# Patient Record
Sex: Male | Born: 1995 | Race: Black or African American | Hispanic: No | Marital: Single | Smoking: Never smoker
Health system: Southern US, Community
[De-identification: ages and names within clinical notes are randomized; demographics above are authoritative.]

---
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
## 2015-07-13 ENCOUNTER — Emergency Department (HOSPITAL_BASED_OUTPATIENT_CLINIC_OR_DEPARTMENT_OTHER)
Admission: EM | Admit: 2015-07-13 | Discharge: 2015-07-13 | Disposition: A | Discharge status: Home / Self Care | Payer: Self-pay | Attending: Emergency Medicine | Admitting: Emergency Medicine

## 2015-07-13 ENCOUNTER — Emergency Department (HOSPITAL_BASED_OUTPATIENT_CLINIC_OR_DEPARTMENT_OTHER): Payer: Self-pay

## 2015-07-13 ENCOUNTER — Encounter (HOSPITAL_BASED_OUTPATIENT_CLINIC_OR_DEPARTMENT_OTHER): Payer: Self-pay | Admitting: Emergency Medicine

## 2015-07-13 DIAGNOSIS — J45909 Unspecified asthma, uncomplicated: Secondary | ICD-10-CM | POA: Insufficient documentation

## 2015-07-13 DIAGNOSIS — H539 Unspecified visual disturbance: Secondary | ICD-10-CM | POA: Insufficient documentation

## 2015-07-13 DIAGNOSIS — R51 Headache: Secondary | ICD-10-CM | POA: Insufficient documentation

## 2015-07-13 DIAGNOSIS — R519 Headache, unspecified: Secondary | ICD-10-CM

## 2015-07-13 MED ORDER — KETOROLAC TROMETHAMINE 30 MG/ML IJ SOLN
30.0000 mg | Freq: Once | INTRAMUSCULAR | Status: AC
  Administered 2015-07-13: 30 mg via INTRAVENOUS
  Filled 2015-07-13: qty 1

## 2015-07-13 MED ORDER — PROMETHAZINE HCL 25 MG/ML IJ SOLN
25.0000 mg | Freq: Once | INTRAMUSCULAR | Status: AC
  Administered 2015-07-13: 25 mg via INTRAVENOUS
  Filled 2015-07-13: qty 1

## 2015-07-13 NOTE — ED Provider Notes (Signed)
CSN: [id number]     Arrival date & time 07/13/15  4098 History   None    Chief Complaint  Patient presents with  . Headache    HPI    20 year old male presenting for headache started yesterday, 3/5.  Patient was at friend's house playing videogames, and on the way home developed frontal headache, pounding in nature such that he had to pull over. He went home and had took a motrin but his headache persisted. Was able to fall asleep eventually but upon awakening this AM, he still had pounding headache. He did not take medicine this AM, but presented to the ED.  Significantly he has a history of headaches typically treated with Motrin but this headache is worse in any previous headaches he ever had. He denies nausea, vomiting associated. However, bright lights and loud sounds exacerbate his headache. He denies fevers, however he recently had a cold that improved a few days before this headache started. He denies associated weakness, changes in vision, numbness tingling, shortness of breath, chest pain,  Past Medical History  Diagnosis Date  . Asthma    No past surgical history on file. No family history on file. Social History  Substance Use Topics  . Smoking status: Never Smoker   . Smokeless tobacco: None  . Alcohol Use: No    Review of Systems  Constitutional: Negative.   HENT: Negative.   Respiratory: Negative.   Cardiovascular: Negative.   Genitourinary: Negative.   Musculoskeletal: Negative.   Skin: Negative.   + headache, + photo phobia, + phonophobia  Allergies  Review of patient's allergies indicates no known allergies.  Home Medications   Prior to Admission medications   Not on File   BP 116/66 mmHg  Pulse 68  Temp(Src) 98.6 F (37 C) (Oral)  Resp 16  Ht  (1.753 m)  Wt 70.308 kg  BMI 22.88 kg/m2  SpO2 100% Physical Exam  Constitutional: He is oriented to person, place, and time. He appears well-developed and well-nourished.  HENT:  Head:  Normocephalic and atraumatic.  Eyes: Conjunctivae and EOM are normal. Pupils are equal, round, and reactive to light.  Neck: Normal range of motion. Neck supple.  Cardiovascular: Normal rate, regular rhythm and normal heart sounds.   Pulmonary/Chest: Effort normal and breath sounds normal. No respiratory distress.  Abdominal: Soft. Bowel sounds are normal. He exhibits no distension. There is no tenderness.  Neurological: He is alert and oriented to person, place, and time. No cranial nerve deficit.  Skin: Skin is warm and dry.  Psychiatric: He has a normal mood and affect.  MSK: Full neck ROM, negative kernig's and Brudzinki's sign Cranial Nerves II - XII - II - Visual field intact OU. III, IV, VI - Extraocular movements intact. V - Facial sensation intact bilaterally. VII - Facial movement intact bilaterally. VIII - Hearing & vestibular intact bilaterally. X - Palate elevates symmetrically, no dysarthria. XI - Chin turning & shoulder shrug intact bilaterally. XII - Tongue protrusion intact.  Motor Strength - The patient's strength was 5/5 in all extremities. Bulk was normal and fasciculations were absent.  Motor Tone - Muscle tone was assessed at the neck and appendages and was normal.   Gait and Station - normal   ED Course  Procedures (including critical care time) Labs Review Labs Reviewed - No data to display  Imaging Review Ct Head Wo Contrast  07/13/2015  CLINICAL DATA:  Headache about both eyes for 3 days. Initial encounter. No known  injury. EXAM: CT HEAD WITHOUT CONTRAST TECHNIQUE: Contiguous axial images were obtained from the base of the skull through the vertex without intravenous contrast. COMPARISON:  None. FINDINGS: The brain appears normal without hemorrhage, infarct, and mass lesion, mass effect, midline shift or abnormal extra-axial fluid collection. No hydrocephalus or pneumocephalus. Small mucous retention cysts or polyps are seen in the sphenoid sinuses. The  calvarium is intact. IMPRESSION: Negative head CT. Electronically Signed   By: Drusilla Kannerhomas  Dalessio M.D.   On: 07/13/2015 12:31   I have personally reviewed and evaluated these images and lab results as part of my medical decision-making.    MDM   Final diagnoses:  Acute nonintractable headache, unspecified headache type    20 year old male presenting for headache. History and physical exam consistent with headache of infectious etiology, negative since he is in Kernig sign, afebrile. Negative history of trauma makes subarachnoid hemorrhage unlikely. Patient given Phenergan and Toradol  in the ED with improvement in headache. CT head negative for pathology. When assessing him he stated that his headache had improved and was texting on his phone.  After demonstrating he was able to tolerate PO and ambulate without difficulty he was discharged to home with return precautions   Pritesh Sobecki A. Kennon RoundsHaney MD, MS Family Medicine Resident PGY-2 Pager [phone]     Bonney AidAlyssa A Annakate Soulier, MD 07/13/15 1245  Bonney AidAlyssa A Tannen Vandezande, MD 07/13/15 1311  Glynn OctaveStephen Rancour, MD 07/13/15 [phone]

## 2015-07-13 NOTE — ED Provider Notes (Signed)
By signing my name below, I, Sonum Patel, attest that this documentation has been prepared under the direction and in the presence of Glynn OctaveStephen Octivia Canion, MD. Electronically Signed: Sonum Patel, Neurosurgeoncribe. 07/13/2015. 11:07 AM.   I saw and evaluated the patient, reviewed the resident's note and I agree with the findings and plan. If applicable, I agree with the resident's interpretation of the EKG.  If applicable, I was present for critical portions of any procedures performed.  HPI Comments: Michael Savage is a 20 y.o. male who presents to the Emergency Department complaining of a gradual onset, constant, gradually worsened frontal HA that began last night while playing video games. He reports associated photophobia and phonophobia. He reports a family history of migraines but states he has not been diagnosed with this. He reports similar HA's last year but states this is the worst case. He denies fever, vomiting, blurry or double vision.   Photophobic. Frontal sinus tenderness. No meningismus. Cranial nerves 2-12 intact. 5/5 strength throughout, no ataxia on finger to nose. Romberg neg. Normal gait. No nystagmus.    Gradual onset headache progressively worse since last night with photophobia and phonophobia and nausea. Nonfocal neurological exam. Low suspicion for subarachnoid hemorrhage, meningitis or temporal arteritis.  I personally performed the services described in this documentation, which was scribed in my presence. The recorded information has been reviewed and is accurate.   Glynn OctaveStephen Luwanda Starr, MD 07/13/15 [phone]

## 2015-07-13 NOTE — ED Notes (Signed)
Headache over both eyes, no fever since Friday.  Pt relates pain is worsening.  No runny nose or cough.  Light and sound sensitive.

## 2015-07-13 NOTE — Discharge Instructions (Signed)
Follow up with PCP for headache If you have worsening headache, weakness, changes in vision, return to the ED for evaluation

## 2015-10-24 ENCOUNTER — Encounter (HOSPITAL_COMMUNITY): Payer: Self-pay | Admitting: Family Medicine

## 2015-10-24 ENCOUNTER — Other Ambulatory Visit: Payer: Self-pay

## 2015-10-24 ENCOUNTER — Emergency Department (HOSPITAL_COMMUNITY)
Admission: EM | Admit: 2015-10-24 | Discharge: 2015-10-24 | Disposition: A | Discharge status: Home / Self Care | Payer: Self-pay | Attending: Emergency Medicine | Admitting: Emergency Medicine

## 2015-10-24 ENCOUNTER — Emergency Department (HOSPITAL_COMMUNITY): Payer: Self-pay

## 2015-10-24 DIAGNOSIS — R079 Chest pain, unspecified: Secondary | ICD-10-CM

## 2015-10-24 DIAGNOSIS — R0602 Shortness of breath: Secondary | ICD-10-CM

## 2015-10-24 DIAGNOSIS — Z791 Long term (current) use of non-steroidal anti-inflammatories (NSAID): Secondary | ICD-10-CM | POA: Insufficient documentation

## 2015-10-24 DIAGNOSIS — J45909 Unspecified asthma, uncomplicated: Secondary | ICD-10-CM | POA: Insufficient documentation

## 2015-10-24 DIAGNOSIS — I319 Disease of pericardium, unspecified: Secondary | ICD-10-CM | POA: Insufficient documentation

## 2015-10-24 LAB — I-STAT TROPONIN, ED
TROPONIN I, POC: 0 ng/mL (ref 0.00–0.08)
Troponin i, poc: 0 ng/mL (ref 0.00–0.08)

## 2015-10-24 LAB — CBC WITH DIFFERENTIAL/PLATELET
BASOS ABS: 0 10*3/uL (ref 0.0–0.1)
Basophils Relative: 0 %
EOS ABS: 0 10*3/uL (ref 0.0–0.7)
Eosinophils Relative: 0 %
HEMATOCRIT: 43.6 % (ref 39.0–52.0)
Hemoglobin: 14.9 g/dL (ref 13.0–17.0)
LYMPHS ABS: 1.1 10*3/uL (ref 0.7–4.0)
Lymphocytes Relative: 23 %
MCH: 31.1 pg (ref 26.0–34.0)
MCHC: 34.2 g/dL (ref 30.0–36.0)
MCV: 91 fL (ref 78.0–100.0)
MONOS PCT: 8 %
Monocytes Absolute: 0.4 10*3/uL (ref 0.1–1.0)
Neutro Abs: 3.4 10*3/uL (ref 1.7–7.7)
Neutrophils Relative %: 69 %
Platelets: 215 10*3/uL (ref 150–400)
RBC: 4.79 MIL/uL (ref 4.22–5.81)
RDW: 13.7 % (ref 11.5–15.5)
WBC: 4.9 10*3/uL (ref 4.0–10.5)

## 2015-10-24 LAB — COMPREHENSIVE METABOLIC PANEL
ALBUMIN: 4.3 g/dL (ref 3.5–5.0)
ALT: 16 U/L — ABNORMAL LOW (ref 17–63)
ANION GAP: 11 (ref 5–15)
AST: 31 U/L (ref 15–41)
Alkaline Phosphatase: 70 U/L (ref 38–126)
BILIRUBIN TOTAL: 1.1 mg/dL (ref 0.3–1.2)
BUN: 14 mg/dL (ref 6–20)
CHLORIDE: 107 mmol/L (ref 101–111)
CO2: 20 mmol/L — AB (ref 22–32)
Calcium: 9.5 mg/dL (ref 8.9–10.3)
Creatinine, Ser: 0.97 mg/dL (ref 0.61–1.24)
GFR calc Af Amer: >60 mL/min (ref >60)
GFR calc non Af Amer: >60 mL/min (ref >60)
GLUCOSE: 102 mg/dL — AB (ref 65–99)
POTASSIUM: 3.8 mmol/L (ref 3.5–5.1)
SODIUM: 138 mmol/L (ref 135–145)
TOTAL PROTEIN: 7.3 g/dL (ref 6.5–8.1)

## 2015-10-24 LAB — D-DIMER, QUANTITATIVE: D-Dimer, Quant: 0.28 ug/mL-FEU (ref 0.00–0.50)

## 2015-10-24 MED ORDER — ALBUTEROL SULFATE (2.5 MG/3ML) 0.083% IN NEBU
5.0000 mg | INHALATION_SOLUTION | Freq: Once | RESPIRATORY_TRACT | Status: AC
  Administered 2015-10-24: 5 mg via RESPIRATORY_TRACT
  Filled 2015-10-24: qty 6

## 2015-10-24 MED ORDER — COLCHICINE 0.6 MG PO TABS: 0.6000 mg | ORAL_TABLET | Freq: Two times a day (BID) | ORAL | Status: AC

## 2015-10-24 MED ORDER — INDOMETHACIN 25 MG PO CAPS: 50.0000 mg | ORAL_CAPSULE | Freq: Three times a day (TID) | ORAL | Status: AC

## 2015-10-24 NOTE — ED Notes (Signed)
Pt here with SOB that started PTA/ sts he was siting when it started. Hx of asthma. Denies anxiety.

## 2015-10-24 NOTE — ED Notes (Signed)
Called lab to add on D dimer  

## 2015-10-24 NOTE — Discharge Instructions (Signed)
Follow-up with your primary care provider on Monday regarding your visit to the emergency department today. Also follow-up with cardiology next week to be seen next week. Take the medications as prescribed. Be sure to take the indomethacin with food.  Return to emergency department if you experience worsening chest pain, worsening shortness of breath, dizziness, sweating, nausea, vomiting, visual changes or you pass out.  Chest Wall Pain Chest wall pain is pain in or around the bones and muscles of your chest. Sometimes, an injury causes this pain. Sometimes, the cause may not be known. This pain may take several weeks or longer to get better. HOME CARE INSTRUCTIONS  Pay attention to any changes in your symptoms. Take these actions to help with your pain:   Rest as told by your health care provider.   Avoid activities that cause pain. These include any activities that use your chest muscles or your abdominal and side muscles to lift heavy items.   If directed, apply ice to the painful area:  Put ice in a plastic bag.  Place a towel between your skin and the bag.  Leave the ice on for 20 minutes, 2-3 times per day.  Take over-the-counter and prescription medicines only as told by your health care provider.  Do not use tobacco products, including cigarettes, chewing tobacco, and e-cigarettes. If you need help quitting, ask your health care provider.  Keep all follow-up visits as told by your health care provider. This is important. SEEK MEDICAL CARE IF:  You have a fever.  Your chest pain becomes worse.  You have new symptoms. SEEK IMMEDIATE MEDICAL CARE IF:  You have nausea or vomiting.  You feel sweaty or light-headed.  You have a cough with phlegm (sputum) or you cough up blood.  You develop shortness of breath.   This information is not intended to replace advice given to you by your health care provider. Make sure you discuss any questions you have with your health  care provider.   Document Released: 04/25/2005 Document Revised: 01/14/2015 Document Reviewed: 07/21/2014 Elsevier Interactive Patient Education 2016 [employer]  Pericarditis Pericarditis is swelling (inflammation) of the pericardium. The pericardium is a thin, double-layered, fluid-filled tissue sac that surrounds the heart. The purpose of the pericardium is to contain the heart in the chest cavity and keep the heart from overexpanding. Different types of pericarditis can occur, such as:  Acute pericarditis. Inflammation can develop suddenly in acute pericarditis.  Chronic pericarditis. Inflammation develops gradually and is long-lasting in chronic pericarditis.  Constrictive pericarditis. In this type of pericarditis, the layers of the pericardium stiffen and develop scar tissue. The scar tissue thickens and sticks together. This makes it difficult for the heart to pump and work as it normally does. CAUSES  Pericarditis can be caused from different conditions, such as:  A bacterial, fungal or viral infection.  After a heart attack (myocardial infarction).  After open-heart surgery (coronary bypass graft surgery).  Auto-immune conditions such as lupus, rheumatoid arthritis or scleroderma.  Kidney failure.  Low thyroid condition (hypothyroidism).  Cancer from another part of the body that has spread (metastasized) to the pericardium.  Chest injury or trauma.  After radiation treatment.  Certain medicines. SYMPTOMS  Symptoms of pericarditis can include:  Chest pain. Chest pain symptoms may increase when laying down and may be relieved when sitting up and leaning forward.  A chronic, dry cough.  Heart palpitations. These may feel like rapid, fluttering or pounding heart beats.  Chest pain may  be worse when swallowing.  Dizziness or fainting.  Tiredness, fatigue or lethargy.  Fever. DIAGNOSIS  Pericarditis is diagnosed by the following:  A physical exam. A heart  sound called a pericardial friction rub may be heard when your caregiver listens to your heart.  Blood work. Blood may be drawn to check for an infection and to look at your blood chemistry.  Electrocardiography. During electrocardiography your heart's electrical activity is monitored and recorded with a tracing on paper (electrocardiogram [ECG]).  Echocardiography.  Computed tomography (CT).  Magnetic resonance image (MRI). TREATMENT  To treat pericarditis, it is important to know the cause of it. The cause of pericarditis determines the treatment.   If the cause of pericarditis is due to an infection, treatment is based on the type of infection. If an infection is suspected in the pericardial fluid, a procedure called a pericardial fluid culture and biopsy may be done. This takes a sample of the pericardial fluid. The sample is sent to a lab which runs tests on the pericardial fluid to check for an infection.  If the autoimmune disease is the cause, treatment of the autoimmune condition will help improve the pericarditis.  If the cause of pericarditis is not known, anti-inflammatory medicines may be used to help decrease the inflammation.  Surgery may be needed. The following are types of surgeries or procedures that may be done to treat pericarditis:  Pericardial window. A pericardial window makes a cut (incision) into the pericardial sac. This allows excess fluid in the pericardium to drain.  Pericardiocentesis. A pericardiocentesis is also known as a pericardial tap. This procedure uses a needle that is guided by X-ray to drain (aspirate) excess fluid from the pericardium.  Pericardiectomy. A pericardiectomy removes part or all of the pericardium. HOME CARE INSTRUCTIONS   Do not smoke. If you smoke, quit. Your caregiver can help you quit smoking.  Maintain a healthy weight.  Follow an exercise program as directed by your health care provider. You may need to limit your  exercising until your symptoms go away.  If you drink alcohol, do so in moderation.  Eat a heart healthy diet. A registered dietitian can help you learn about healthy food choices.  Keep a list of all your medicines with you at all times. Include the name, dose, how often it is taken and how it is taken. SEEK IMMEDIATE MEDICAL CARE IF:   You have chest pain or feelings of chest pressure.  You have sweating (diaphoresis) when at rest.  You have irregular heartbeats (palpitations).  You have rapid, racing heart beats.  You have unexplained fainting episodes.  You feel sick to your stomach (nausea) or vomiting without cause.  You have unexplained weakness. If you develop any of the symptoms which originally made you seek care, call for local emergency medical help. Do not drive yourself to the hospital.   This information is not intended to replace advice given to you by your health care provider. Make sure you discuss any questions you have with your health care provider.   Document Released: 10/19/2000 Document Revised: 09/09/2014 Document Reviewed: 11/05/2014 Elsevier Interactive Patient Education [employer]2016 Elsevier Inc.

## 2015-10-24 NOTE — ED Notes (Signed)
Called lab to inquire about d dimer.

## 2015-10-24 NOTE — ED Notes (Signed)
Pt A&OX4, ambulatory at d/c with steady gait, NAD. Pt verbalized discharge instructions and follow up care and prescription information. Pt states he will follow up with cardiology and his PCP as soon as possible. Pt discharged with family and states he has all of his belongings with him at d/c.

## 2015-10-24 NOTE — ED Provider Notes (Signed)
CSN: [id number]     Arrival date & time 10/24/15  1107 History   First MD Initiated Contact with Patient 10/24/15 1132     Chief Complaint  Patient presents with  . Shortness of Breath     (Consider location/radiation/quality/duration/timing/severity/associated sxs/prior Treatment) HPI   Patient is a 20 year old male with a history of asthma who presents the ED with rapid onset chest tightness and shortness of breath since 1100am. He states this does not feel like other episodes of asthma. Associated dizziness. Chest tightness is constant, position does not make it better or worse, difficulty taking a deep breath. Patient states he was girlfriend got into an argument prior to the onset of symptoms. Patient denies IV drug use or cocaine use. Patient denies headache, blurred vision, nausea, vomiting, fever, abdominal pain, diaphoresis.  Past Medical History  Diagnosis Date  . Asthma    History reviewed. No pertinent past surgical history. History reviewed. No pertinent family history. Social History  Substance Use Topics  . Smoking status: Never Smoker   . Smokeless tobacco: None  . Alcohol Use: No    Review of Systems  Constitutional: Negative for fever and chills.  HENT: Negative for voice change.   Eyes: Negative for visual disturbance.  Respiratory: Positive for chest tightness and shortness of breath. Negative for cough and wheezing.   Cardiovascular: Negative for chest pain and leg swelling.  Gastrointestinal: Negative for nausea, vomiting, abdominal pain and diarrhea.  Musculoskeletal: Negative for back pain.  Skin: Negative for rash.  Allergic/Immunologic: Negative for immunocompromised state.  Neurological: Positive for dizziness. Negative for syncope, weakness, numbness and headaches.      Allergies  Review of patient's allergies indicates no known allergies.  Home Medications   Prior to Admission medications   Medication Sig Start Date End Date Taking?  Authorizing Provider  colchicine 0.6 MG tablet Take 1 tablet (0.6 mg total) by mouth 2 (two) times daily. 10/24/15   Jerre Simon, PA  indomethacin (INDOCIN) 25 MG capsule Take 2 capsules (50 mg total) by mouth 3 (three) times daily with meals. 10/24/15   Shaquel Josephson L Lezly Rumpf, PA   BP 105/67 mmHg  Pulse 122  Temp(Src) 97.8 F (36.6 C) (Oral)  Resp 20  SpO2 100% Physical Exam  Constitutional: He appears well-developed and well-nourished. No distress.  Pt appears anxious and is trembling   HENT:  Head: Normocephalic and atraumatic.  Eyes: Conjunctivae are normal.  Neck: Normal range of motion.  Cardiovascular: Regular rhythm and normal heart sounds.  Tachycardia present.  Exam reveals no gallop and no friction rub.   No murmur heard. Pulses:      Radial pulses are 2+ on the right side, and 2+ on the left side.       Dorsalis pedis pulses are 2+ on the right side, and 2+ on the left side.  Pulmonary/Chest: Effort normal and breath sounds normal. No respiratory distress. He has no wheezes. He has no rales.  Abdominal: Soft. There is no tenderness.  Musculoskeletal: Normal range of motion. He exhibits no edema.  Neurological: He is alert. Coordination normal.  Skin: Skin is warm and dry. No rash noted. He is not diaphoretic.  Psychiatric: His speech is normal and behavior is normal. His mood appears anxious.  Nursing note and vitals reviewed.   ED Course  Procedures (including critical care time) Labs Review Labs Reviewed  COMPREHENSIVE METABOLIC PANEL - Abnormal; Notable for the following:    CO2 20 (*)    Glucose,  Bld 102 (*)    ALT 16 (*)    All other components within normal limits  CBC WITH DIFFERENTIAL/PLATELET  D-DIMER, QUANTITATIVE (NOT AT [facility])  Rosezena SensorI-STAT TROPOININ, ED  Rosezena SensorI-STAT TROPOININ, ED    Imaging Review Dg Chest 2 View  10/24/2015  CLINICAL DATA:  Shortness of breath. EXAM: CHEST  2 VIEW COMPARISON:  Chest CT 09/23/2013. FINDINGS: The heart size and mediastinal  contours are within normal limits. Both lungs are clear. The visualized skeletal structures are unremarkable. IMPRESSION: No active cardiopulmonary disease. Electronically Signed   By: Annia Beltrew  Davis M.D.   On: 10/24/2015 12:36   I have personally reviewed and evaluated these images and lab results as part of my medical decision-making.   EKG Interpretation   Date/Time:  Saturday October 24 2015 11:15:16 EDT Ventricular Rate:  120 PR Interval:  154 QRS Duration: 94 QT Interval:  320 QTC Calculation: 452 R Axis:   79 Text Interpretation:  Sinus tachycardia ST elevations primarily with  appearance of benign early repolarization, howeverconcern for more diffuse  PR depressions and ST  elevations for possible pericarditis No previous  ECGs available Confirmed by [facility] MD, ERIN ([id number]) on 10/24/2015  5:52:46 PM      MDM   Final diagnoses:  Chest pain, unspecified chest pain type  Pericarditis  Shortness of breath   Patient with chest pain and shortness of breath. EKG concerning for pericarditis. Chest x-ray revealed no acute cardiopulmonary disease. D-dimer negative, troponins x2 negative, other labs unremarkable, O2 sats 99-100%, VSS except for mild intermittent tachycardia. Patient states he was girlfriend had an argument before the onset of symptoms.  Will treat patient for pericarditis with indomethacin and colchicine. Discussed strict follow-up with cardiology within 2 days. Patient was stable at time of discharge, he states his breathing and chest pain had improved. Instructed patient to follow-up with the CP and cardiology and discussed strict return precautions to include but not limited to: chest pain becomes exertional with associated shortness of breath and diaphoresis or pain radiating to left jaw and left arm.  Patient expressed understanding to the discharge instructions.  Jerre SimonJessica L Mckade Gurka, PA 10/25/15 1615  Alvira MondayErin Schlossman, MD 10/27/15 [phone]

## 2017-03-18 IMAGING — CR DG CHEST 2V
2 series · 2 of 2 positions shown · non-contrast
Comparison: Chest CT 09/23/2013.

CLINICAL DATA: Shortness of breath.

EXAM:
CHEST  2 VIEW

[chest pa]
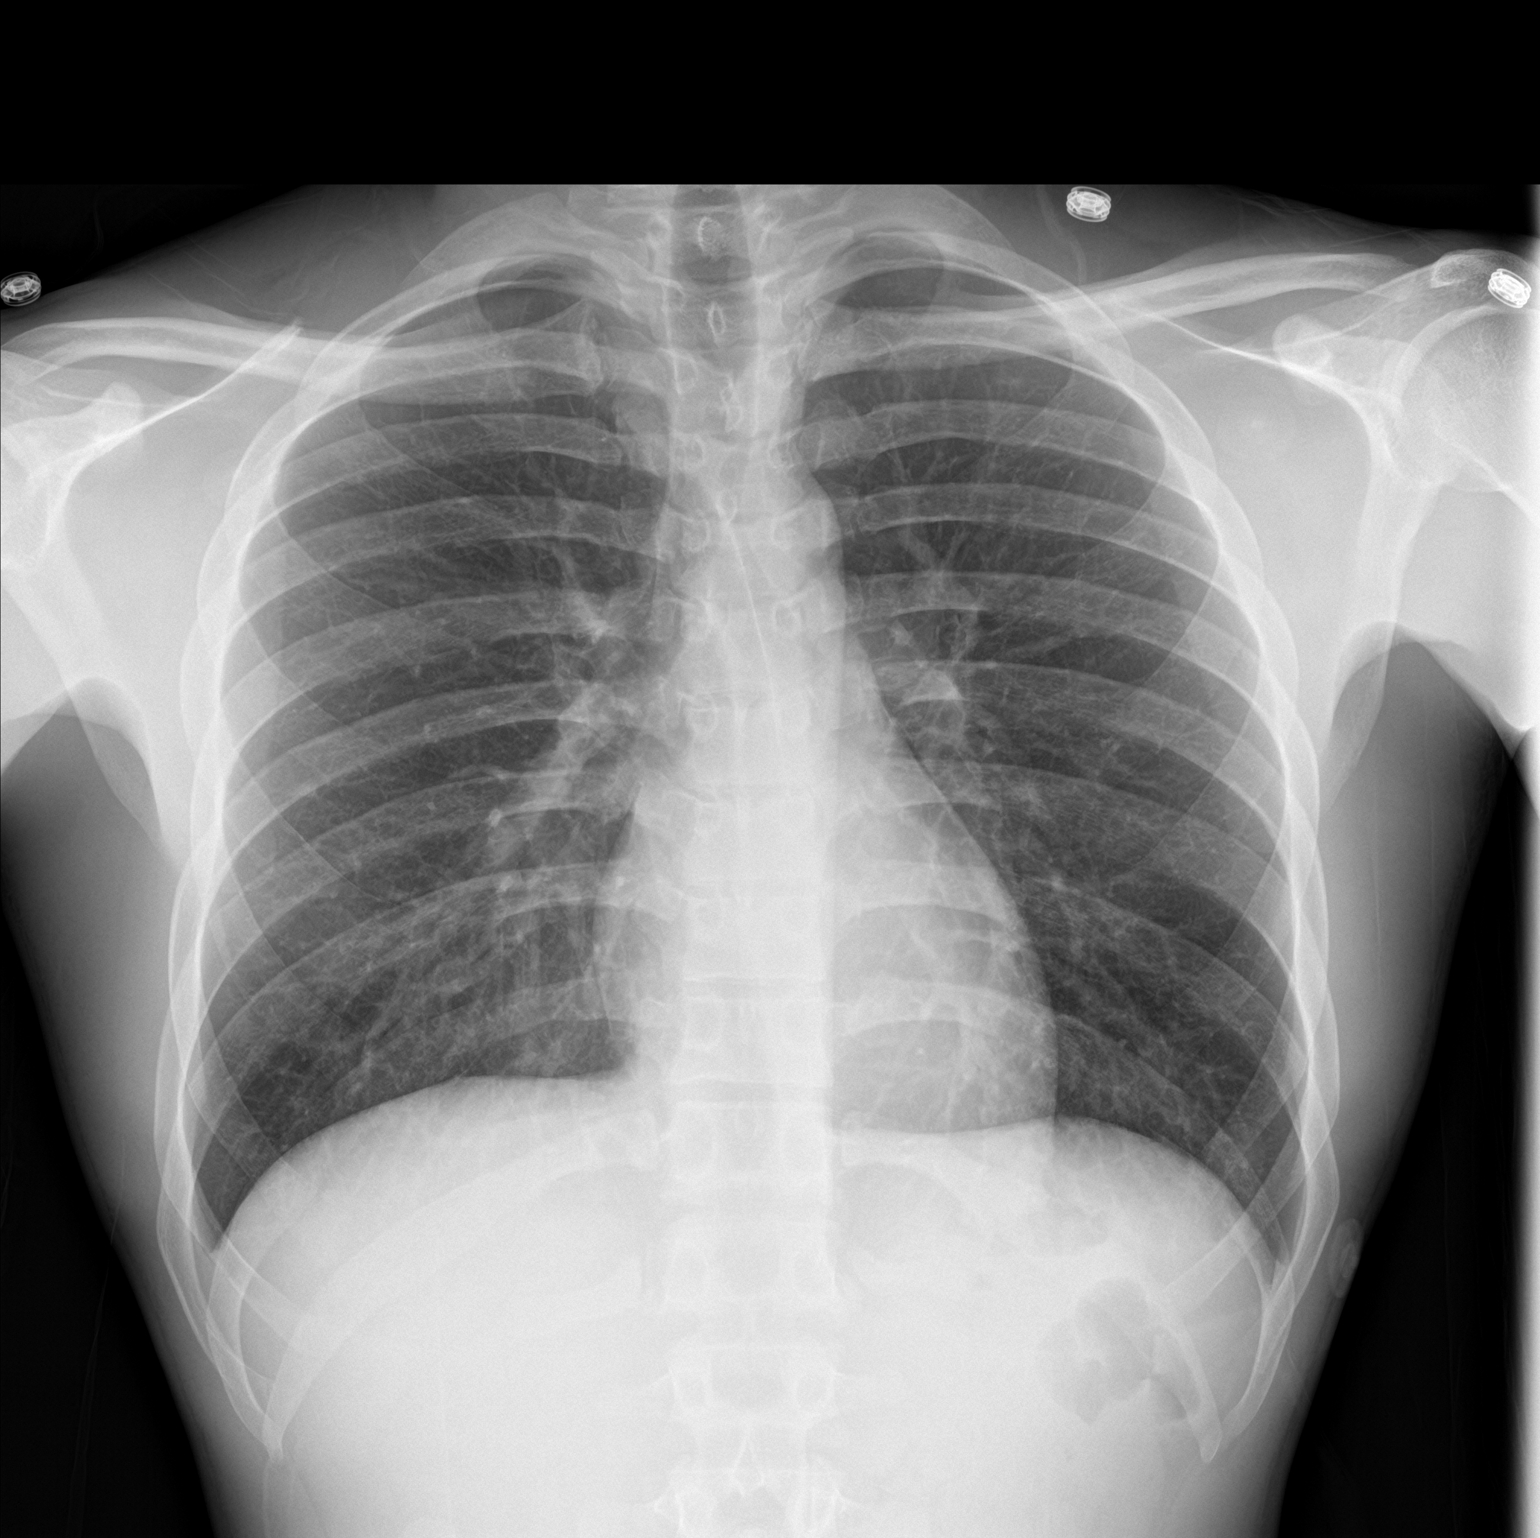

[chest lat]
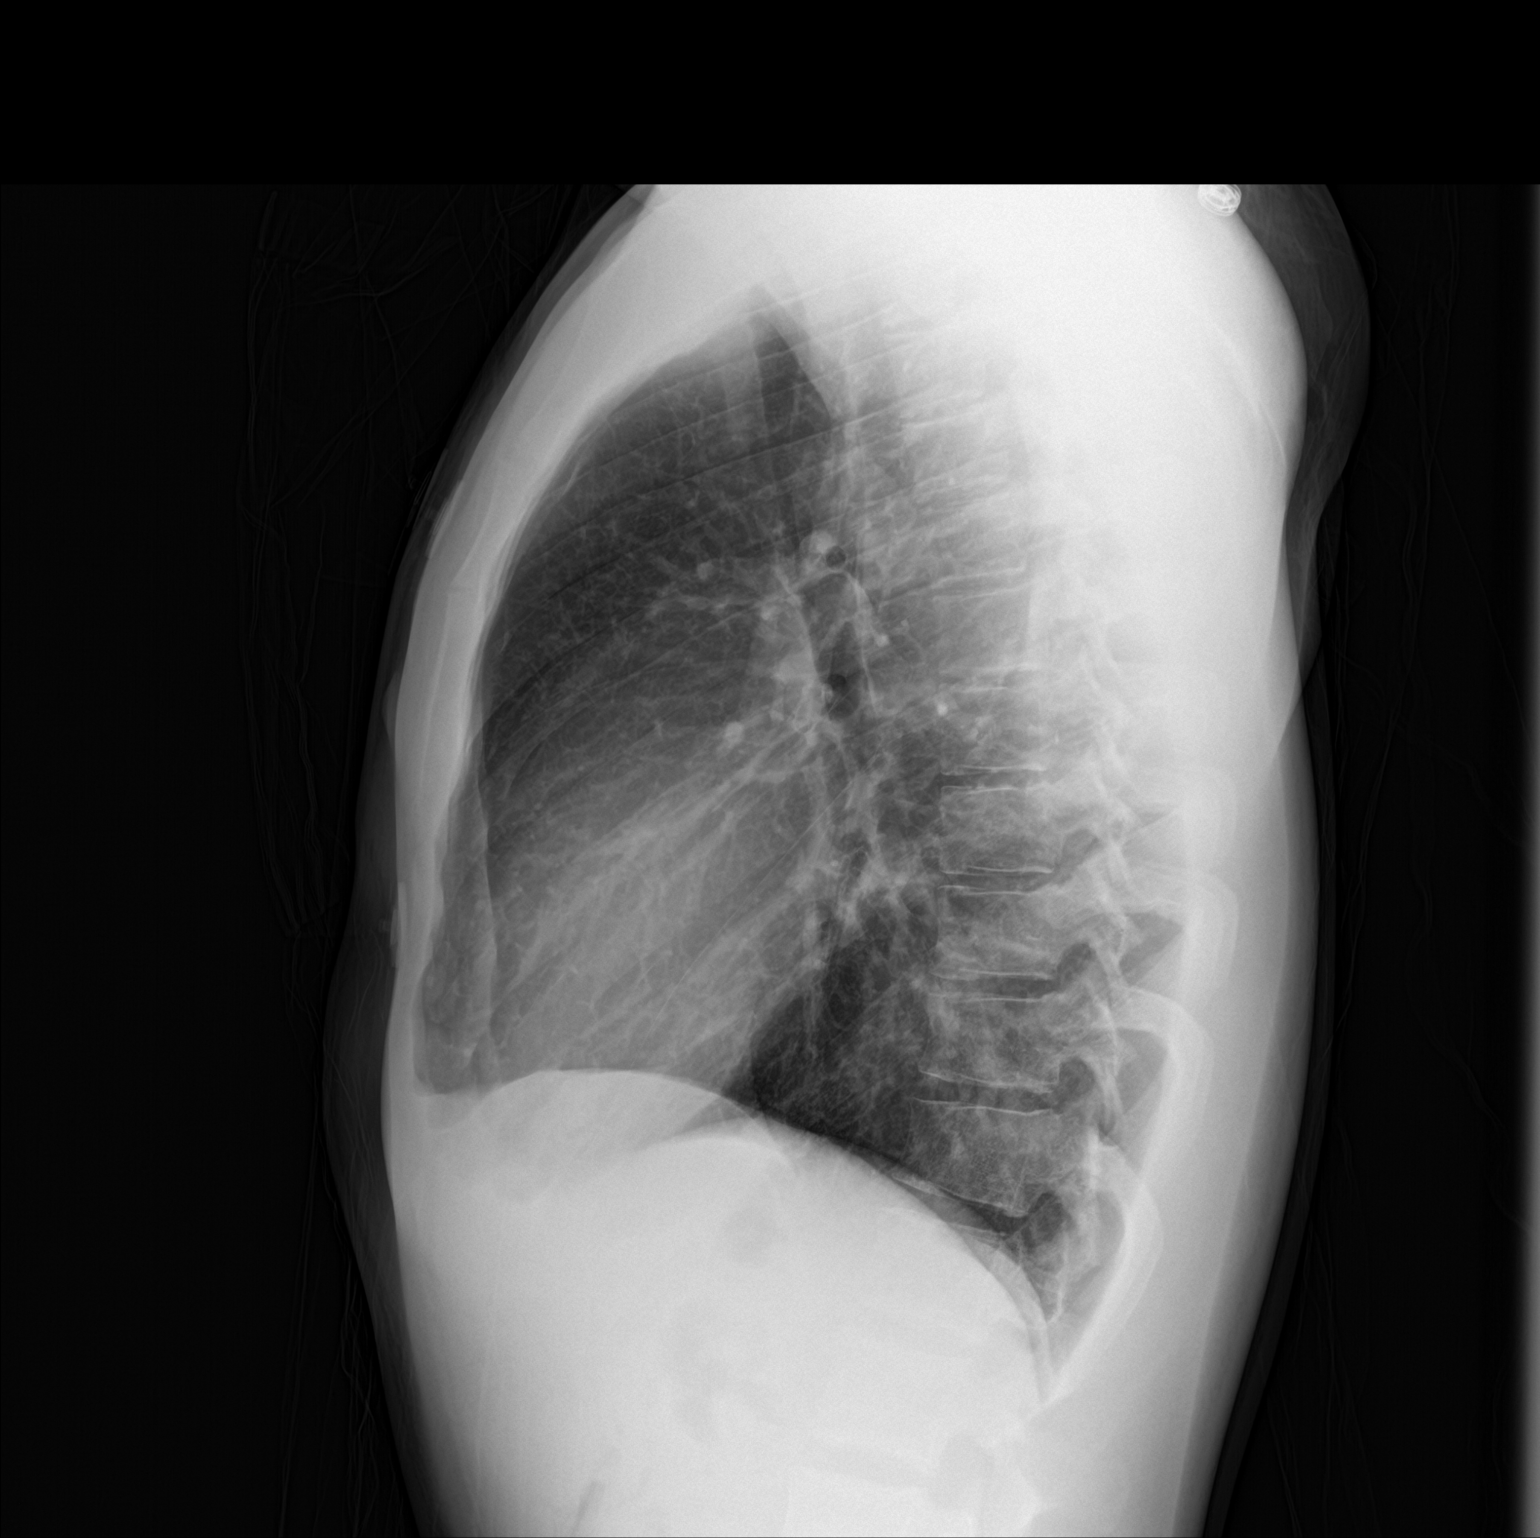

[2 of 2 positions shown; findings below may reference images not displayed]

FINDINGS: The heart size and mediastinal contours are within normal limits.
Both lungs are clear. The visualized skeletal structures are
unremarkable.
IMPRESSION: No active cardiopulmonary disease.
